# Patient Record
Sex: Female | Born: 1985 | Race: Black or African American | Hispanic: No | Marital: Single | Smoking: Never smoker
Health system: Southern US, Community
[De-identification: ages and names within clinical notes are randomized; demographics above are authoritative.]

---

## 2010-05-05 ENCOUNTER — Inpatient Hospital Stay (HOSPITAL_COMMUNITY): Admission: AD | Admit: 2010-05-05 | Discharge: 2010-05-05 | Payer: Self-pay | Admitting: Obstetrics & Gynecology

## 2010-05-05 ENCOUNTER — Ambulatory Visit: Payer: Self-pay | Admitting: Obstetrics & Gynecology

## 2010-06-16 ENCOUNTER — Ambulatory Visit (HOSPITAL_COMMUNITY): Admission: RE | Admit: 2010-06-16 | Discharge: 2010-06-16 | Payer: Self-pay | Admitting: Family Medicine

## 2010-07-04 ENCOUNTER — Inpatient Hospital Stay (HOSPITAL_COMMUNITY): Admission: AD | Admit: 2010-07-04 | Discharge: 2010-07-05 | Payer: Self-pay | Admitting: Obstetrics & Gynecology

## 2010-07-04 ENCOUNTER — Ambulatory Visit: Payer: Self-pay | Admitting: Physician Assistant

## 2010-07-30 ENCOUNTER — Ambulatory Visit (HOSPITAL_COMMUNITY): Admission: RE | Admit: 2010-07-30 | Discharge: 2010-07-30 | Payer: Self-pay | Admitting: Family Medicine

## 2010-08-10 ENCOUNTER — Inpatient Hospital Stay (HOSPITAL_COMMUNITY): Admission: AD | Admit: 2010-08-10 | Discharge: 2010-08-10 | Payer: Self-pay | Admitting: Obstetrics & Gynecology

## 2010-08-10 ENCOUNTER — Ambulatory Visit: Payer: Self-pay | Admitting: Obstetrics and Gynecology

## 2010-12-06 ENCOUNTER — Encounter: Payer: Self-pay | Admitting: Family Medicine

## 2010-12-18 ENCOUNTER — Other Ambulatory Visit: Payer: Self-pay | Admitting: Family Medicine

## 2010-12-18 ENCOUNTER — Ambulatory Visit (HOSPITAL_COMMUNITY)
Admission: RE | Admit: 2010-12-18 | Discharge: 2010-12-18 | Disposition: A | Discharge status: Home / Self Care | Payer: Medicaid Other | Source: Ambulatory Visit | Attending: Obstetrics & Gynecology | Admitting: Obstetrics & Gynecology

## 2010-12-18 DIAGNOSIS — I498 Other specified cardiac arrhythmias: Secondary | ICD-10-CM | POA: Insufficient documentation

## 2010-12-21 ENCOUNTER — Encounter (HOSPITAL_COMMUNITY): Payer: Self-pay

## 2010-12-21 ENCOUNTER — Ambulatory Visit (HOSPITAL_COMMUNITY)
Admission: RE | Admit: 2010-12-21 | Discharge: 2010-12-21 | Disposition: A | Discharge status: Home / Self Care | Payer: Medicaid Other | Source: Ambulatory Visit | Attending: Family Medicine | Admitting: Family Medicine

## 2010-12-21 DIAGNOSIS — Z3689 Encounter for other specified antenatal screening: Secondary | ICD-10-CM | POA: Insufficient documentation

## 2010-12-21 DIAGNOSIS — O352XX0 Maternal care for (suspected) hereditary disease in fetus, not applicable or unspecified: Secondary | ICD-10-CM | POA: Insufficient documentation

## 2010-12-21 DIAGNOSIS — O3660X0 Maternal care for excessive fetal growth, unspecified trimester, not applicable or unspecified: Secondary | ICD-10-CM | POA: Insufficient documentation

## 2010-12-24 ENCOUNTER — Other Ambulatory Visit: Payer: Self-pay | Admitting: Family Medicine

## 2010-12-24 DIAGNOSIS — O480 Post-term pregnancy: Secondary | ICD-10-CM

## 2010-12-28 ENCOUNTER — Inpatient Hospital Stay (HOSPITAL_COMMUNITY)
Admission: AD | Admit: 2010-12-28 | Discharge: 2010-12-30 | DRG: 775 | Disposition: A | Discharge status: Home / Self Care | Payer: Medicaid Other | Source: Ambulatory Visit | Attending: Obstetrics and Gynecology | Admitting: Obstetrics and Gynecology

## 2010-12-28 ENCOUNTER — Ambulatory Visit (HOSPITAL_COMMUNITY): Admission: RE | Admit: 2010-12-28 | Payer: Medicaid Other | Source: Ambulatory Visit

## 2010-12-28 DIAGNOSIS — O99892 Other specified diseases and conditions complicating childbirth: Secondary | ICD-10-CM | POA: Diagnosis present

## 2010-12-28 DIAGNOSIS — O4290 Premature rupture of membranes, unspecified as to length of time between rupture and onset of labor, unspecified weeks of gestation: Secondary | ICD-10-CM

## 2010-12-28 DIAGNOSIS — Z22330 Carrier of Group B streptococcus: Secondary | ICD-10-CM

## 2010-12-28 DIAGNOSIS — O9989 Other specified diseases and conditions complicating pregnancy, childbirth and the puerperium: Secondary | ICD-10-CM

## 2010-12-28 LAB — CBC
HCT: 33.4 % — ABNORMAL LOW (ref 36.0–46.0)
Hemoglobin: 10.7 g/dL — ABNORMAL LOW (ref 12.0–15.0)
MCHC: 32 g/dL (ref 30.0–36.0)
MCV: 83.3 fL (ref 78.0–100.0)
Platelets: 230 10*3/uL (ref 150–400)
RBC: 4.01 MIL/uL (ref 3.87–5.11)
WBC: 9 10*3/uL (ref 4.0–10.5)

## 2010-12-29 DIAGNOSIS — O9989 Other specified diseases and conditions complicating pregnancy, childbirth and the puerperium: Secondary | ICD-10-CM

## 2010-12-29 DIAGNOSIS — O4290 Premature rupture of membranes, unspecified as to length of time between rupture and onset of labor, unspecified weeks of gestation: Secondary | ICD-10-CM

## 2011-01-28 LAB — URINALYSIS, ROUTINE W REFLEX MICROSCOPIC
Bilirubin Urine: NEGATIVE
Glucose, UA: NEGATIVE mg/dL
Hgb urine dipstick: NEGATIVE
Ketones, ur: NEGATIVE mg/dL
Nitrite: NEGATIVE
Protein, ur: NEGATIVE mg/dL
Specific Gravity, Urine: >1.03 — ABNORMAL HIGH (ref 1.005–1.030)
Urobilinogen, UA: 0.2 mg/dL (ref 0.0–1.0)
pH: 6 (ref 5.0–8.0)

## 2011-01-28 LAB — URINE MICROSCOPIC-ADD ON

## 2011-01-28 LAB — GC/CHLAMYDIA PROBE AMP, GENITAL
Chlamydia, DNA Probe: NEGATIVE
GC Probe Amp, Genital: NEGATIVE

## 2011-01-31 LAB — CBC
MCHC: 33.8 g/dL (ref 30.0–36.0)
MCV: 91.3 fL (ref 78.0–100.0)

## 2011-01-31 LAB — URINALYSIS, ROUTINE W REFLEX MICROSCOPIC
Bilirubin Urine: NEGATIVE
Glucose, UA: NEGATIVE mg/dL
Ketones, ur: 40 mg/dL — AB
Leukocytes, UA: NEGATIVE
Specific Gravity, Urine: >1.03 — ABNORMAL HIGH (ref 1.005–1.030)
pH: 6 (ref 5.0–8.0)

## 2011-01-31 LAB — WET PREP, GENITAL
Clue Cells Wet Prep HPF POC: NONE SEEN
Yeast Wet Prep HPF POC: NONE SEEN

## 2011-01-31 LAB — COMPREHENSIVE METABOLIC PANEL
AST: 21 U/L (ref 0–37)
Alkaline Phosphatase: 63 U/L (ref 39–117)
BUN: 10 mg/dL (ref 6–23)
CO2: 24 mEq/L (ref 19–32)
Calcium: 9.5 mg/dL (ref 8.4–10.5)
GFR calc non Af Amer: >60 mL/min (ref >60)
Glucose, Bld: 80 mg/dL (ref 70–99)
Potassium: 4 mEq/L (ref 3.5–5.1)

## 2011-01-31 LAB — URINE MICROSCOPIC-ADD ON

## 2011-01-31 LAB — POCT PREGNANCY, URINE: Preg Test, Ur: POSITIVE

## 2012-02-11 IMAGING — US US OB NUCHAL TRANSLUCENCY 1ST GEST
1 series · 14 of 28 positions shown · non-contrast
Comparison: none

OBSTETRICAL ULTRASOUND:
 This ultrasound was performed in The [HOSPITAL], and the [facility] report will be stored to [REDACTED] PACS.  This report is also available in [HOSPITAL]?s accessANYware.

[Series 1: us ob nuchal translucency 1st gest · 14 of 36 slices shown]
[im 2/36]
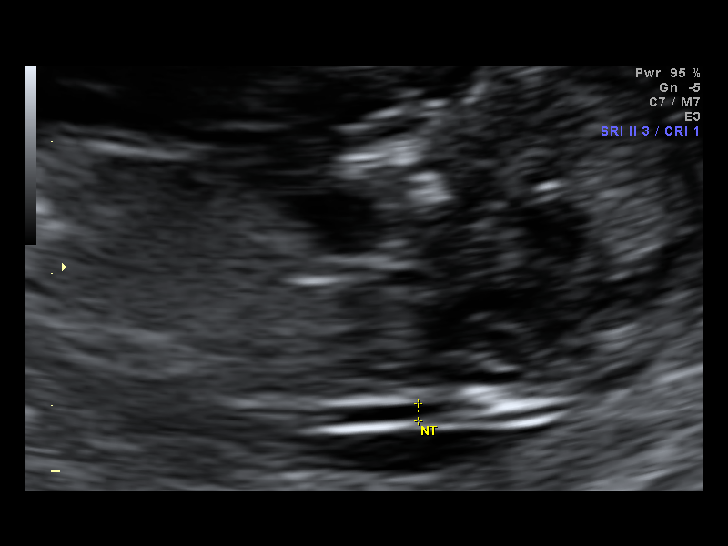
[im 4/36]
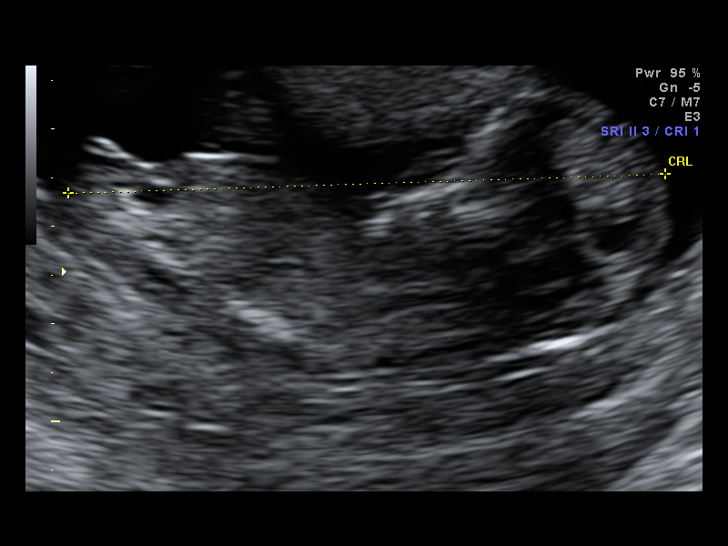
[im 7/36]
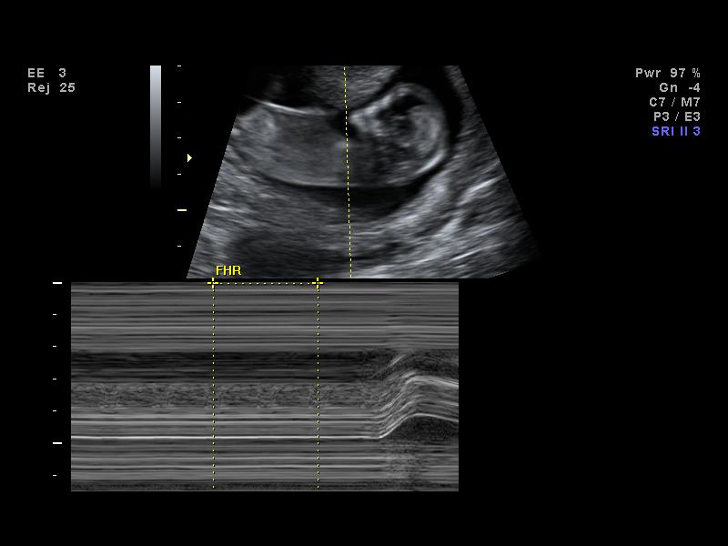
[im 10/36]
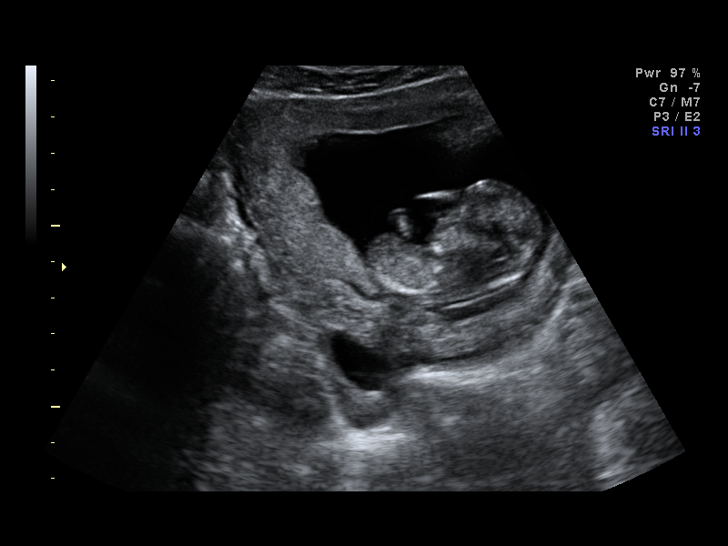
[im 12/36]
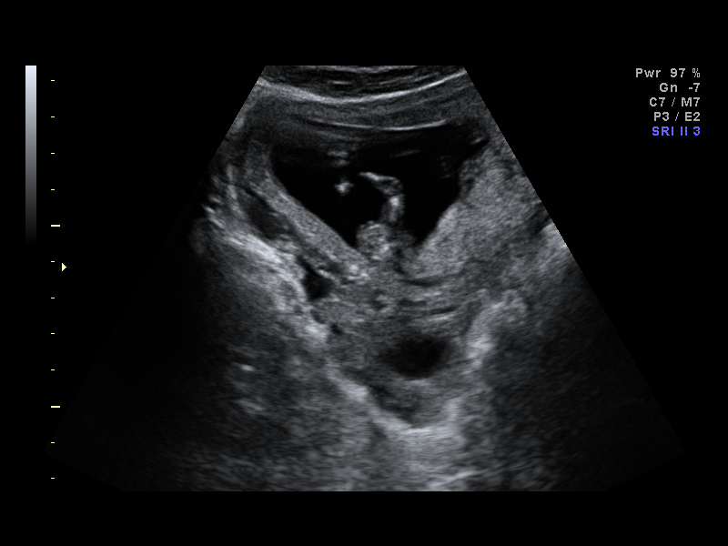
[im 15/36]
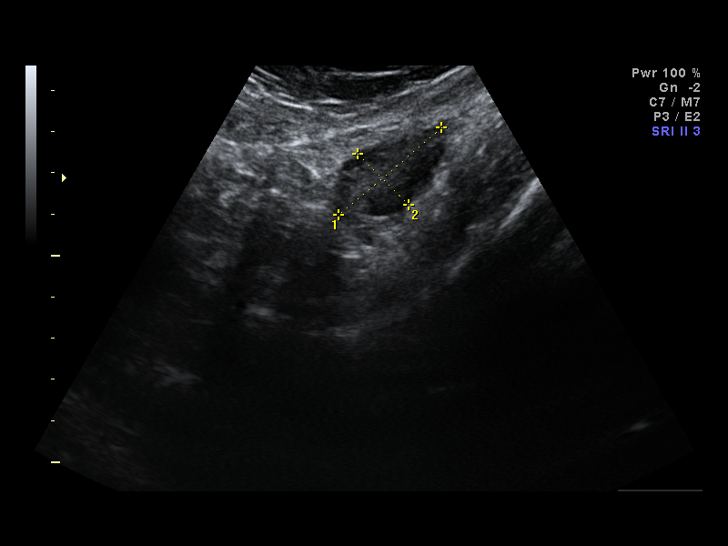
[im 17/36]
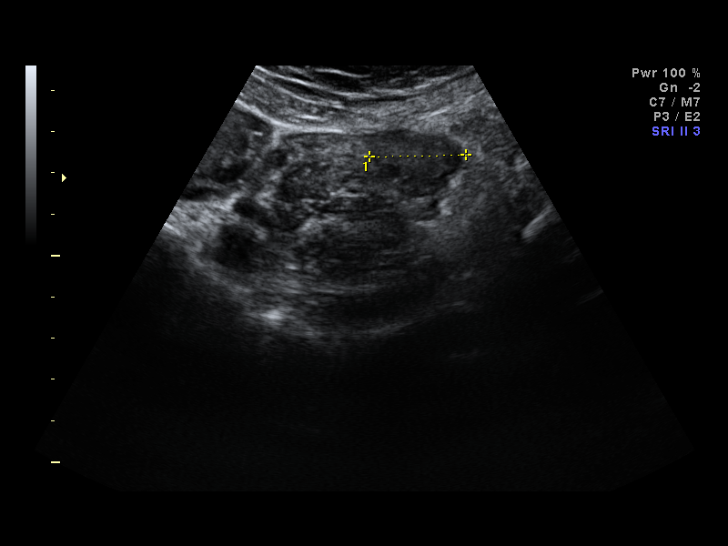
[im 20/36]
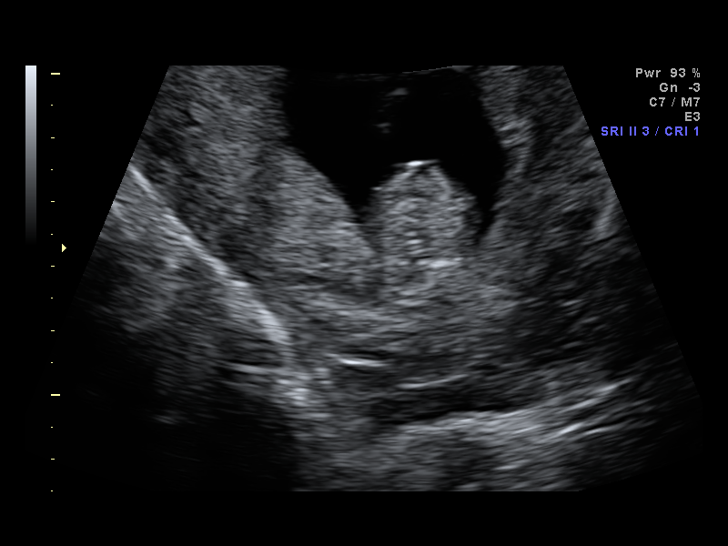
[im 23/36]
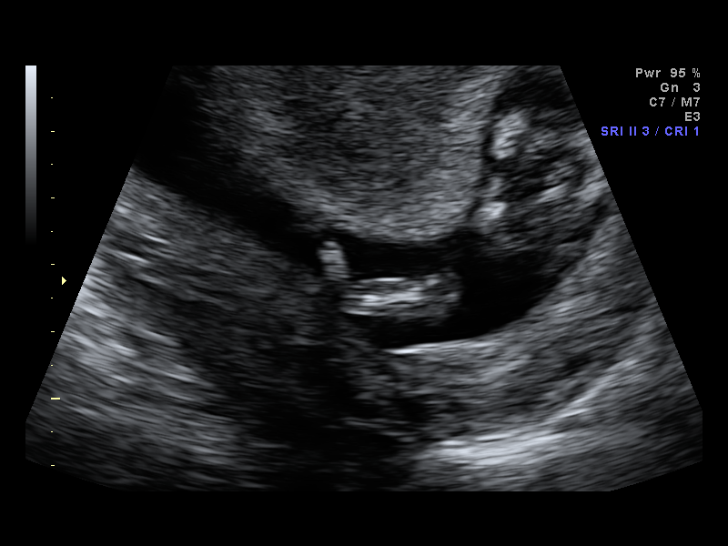
[im 25/36]
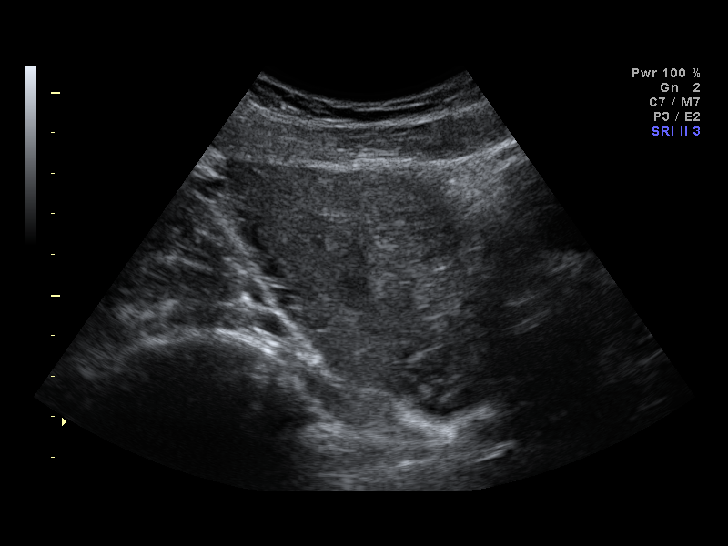
[im 28/36]
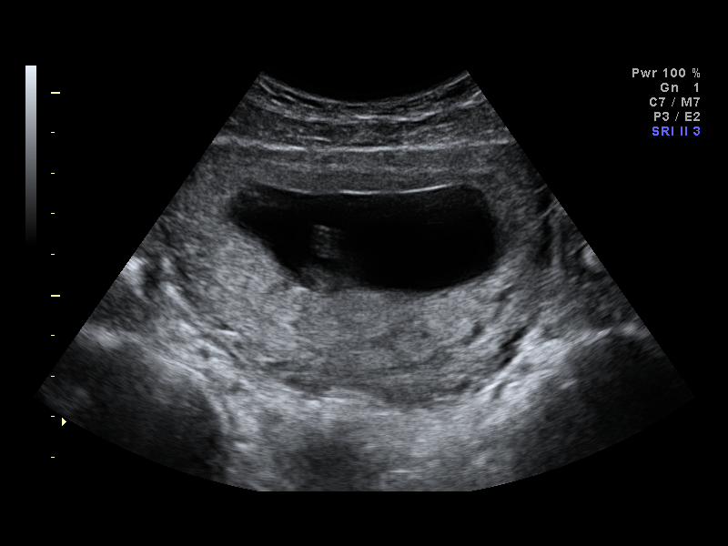
[im 30/36]
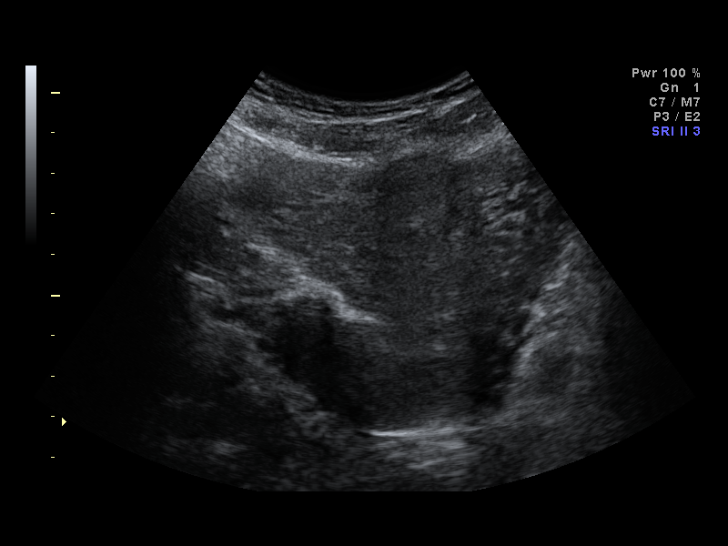
[im 33/36]
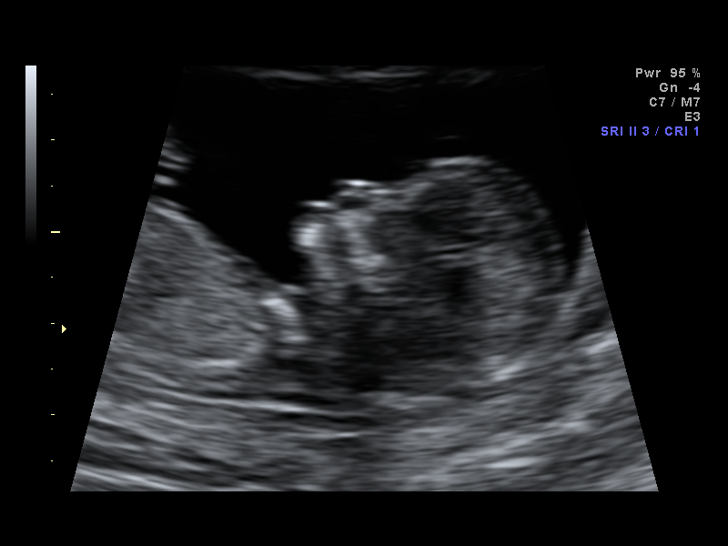
[im 36/36]
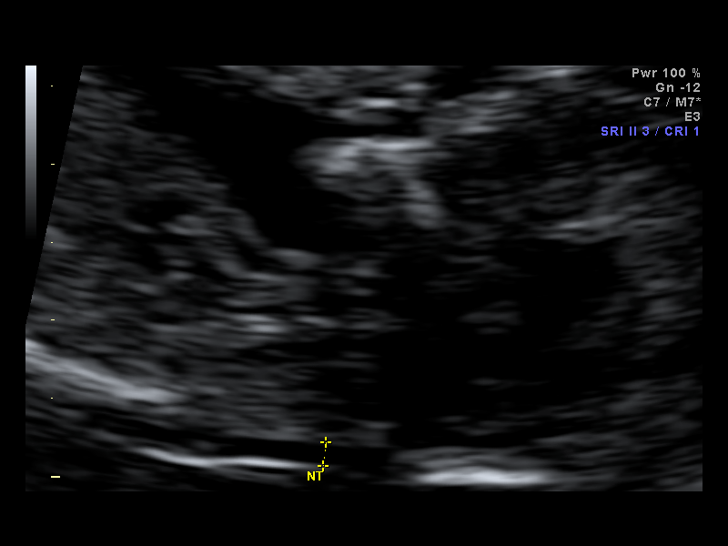

[14 of 28 positions shown; findings below may reference images not displayed]

IMPRESSION: [facility] has also been faxed to the ordering physician.

## 2012-08-17 IMAGING — US US OB FOLLOW-UP
1 series · 12 of 28 positions shown · non-contrast
Comparison: none

[Series 1: us ob follow up · 12 of 64 slices shown]
[im 3/64]
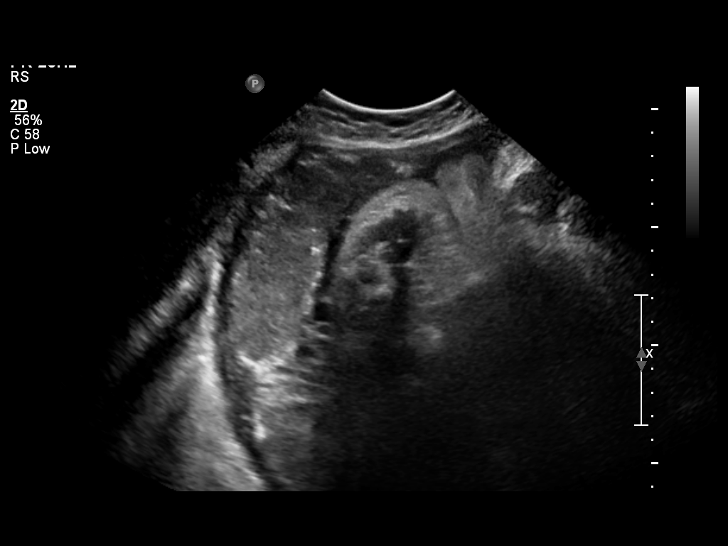
[im 8/64]
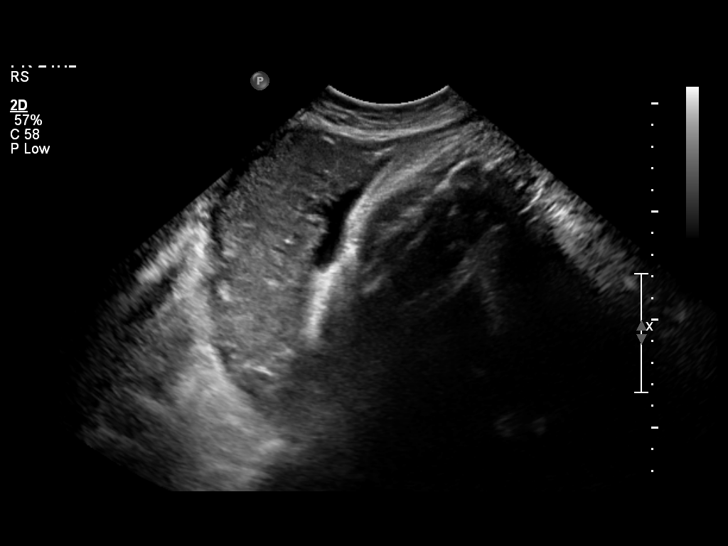
[im 12/64]
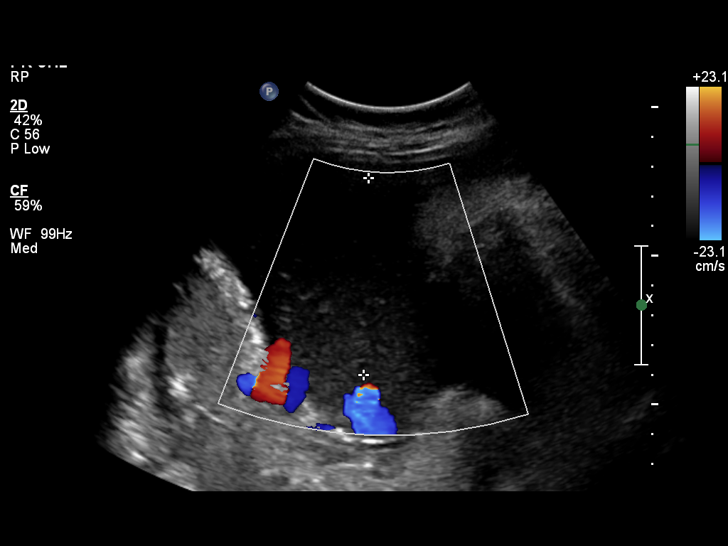
[im 19/64]
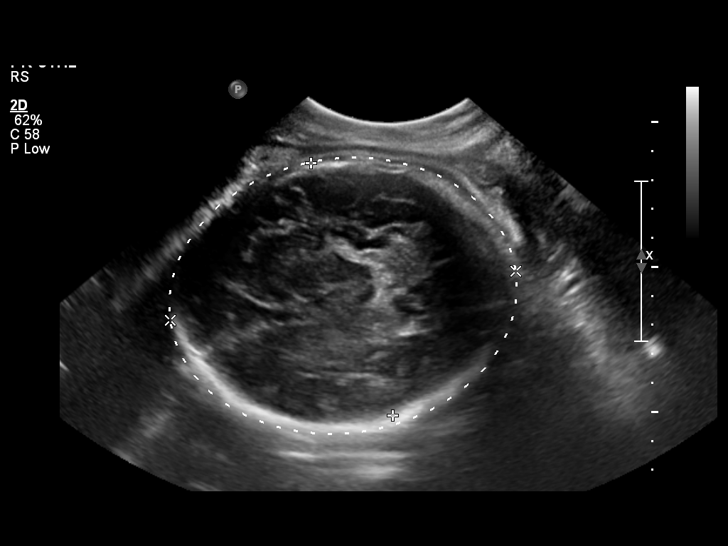
[im 24/64]
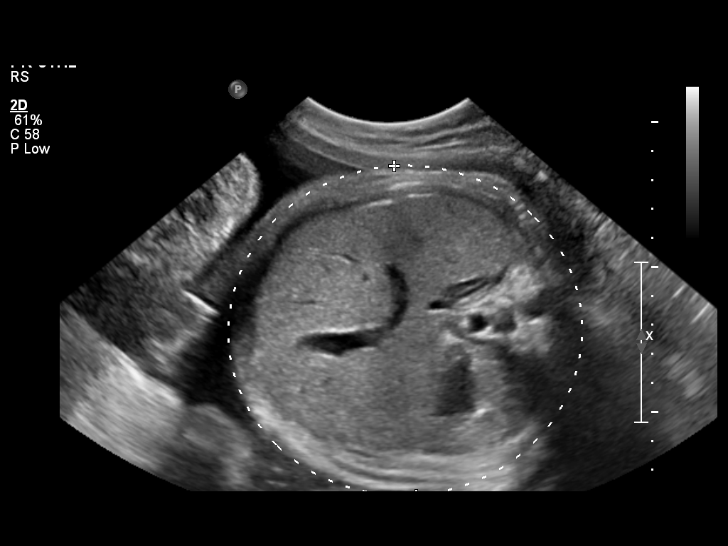
[im 29/64]
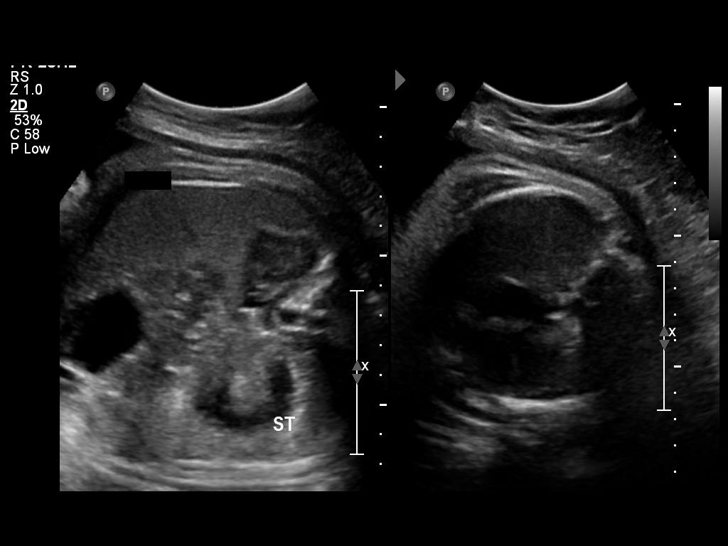
[im 36/64]
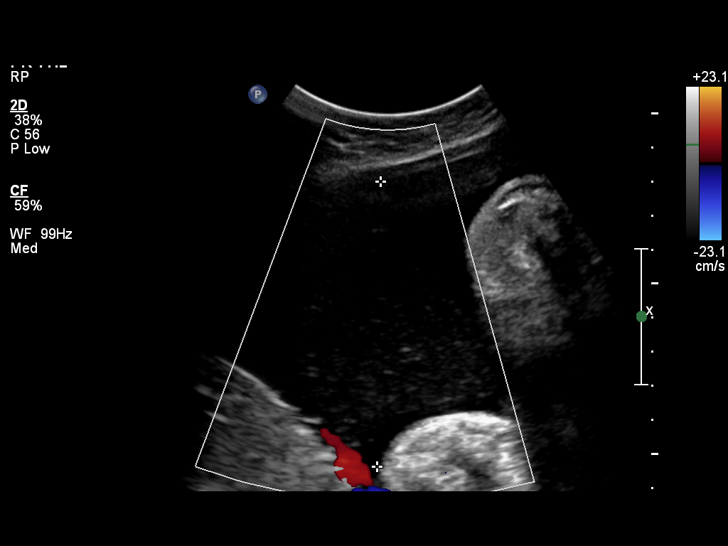
[im 40/64]
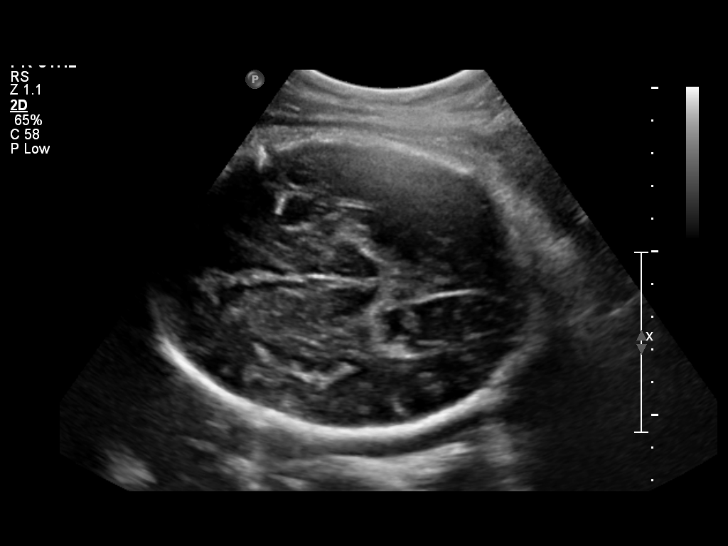
[im 45/64]
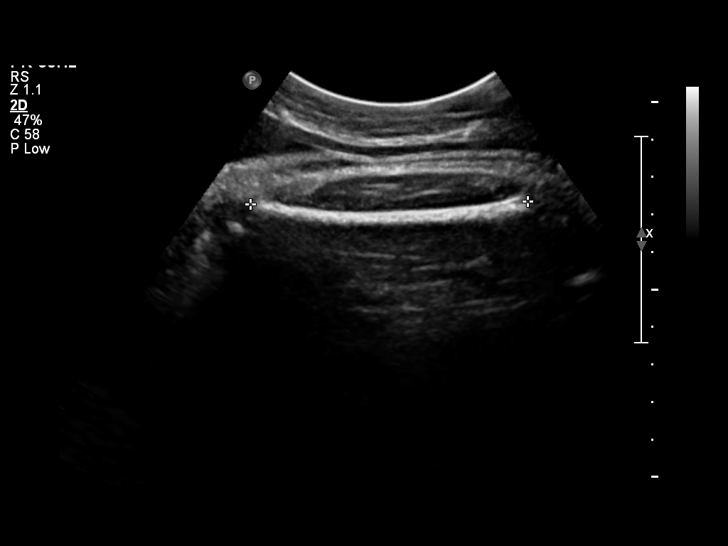
[im 52/64]
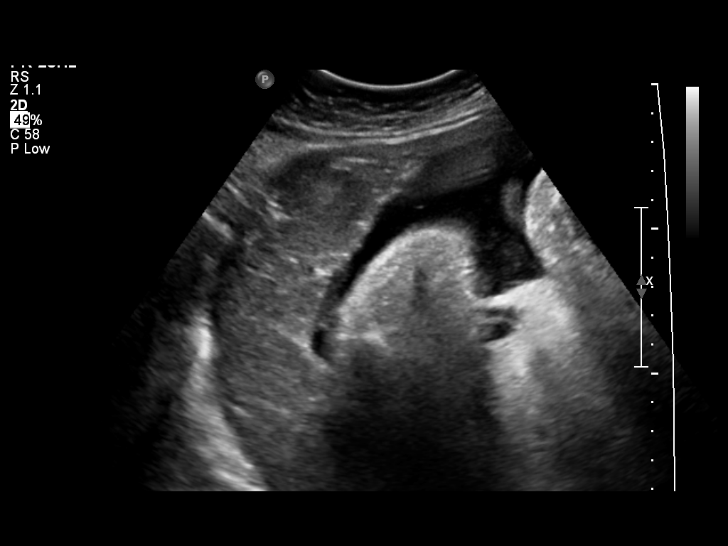
[im 57/64]
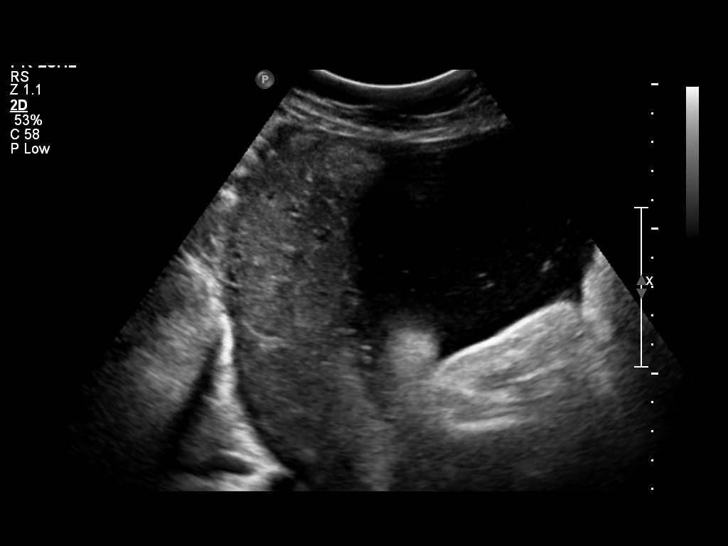
[im 61/64]
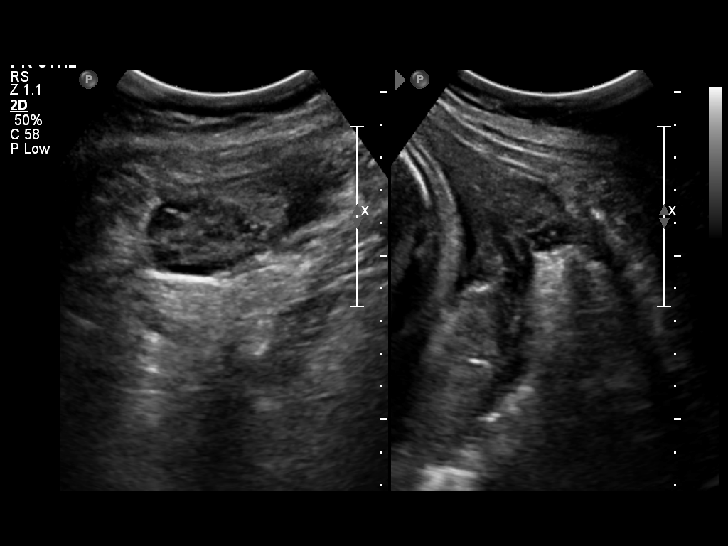

[12 of 28 positions shown; findings below may reference images not displayed]

OBSTETRICS REPORT
                      (Signed Final 12/21/2010 [DATE])

                 [REDACTED]-
                 Faculty Physician
 Order#:         [id number]
Procedures

 US OB FOLLOW UP                                       76816.1
Indications

 History of genetic / anatomic abnormality - pt with
 ear tag & brother polydactyly
 Size greater than dates (Large for gestational [AGE]
 Assess Fetal Growth / Estimated Fetal Weight
Fetal Evaluation

 Fetal Heart Rate:  156                          bpm
 Cardiac Activity:  Observed
 Presentation:      Cephalic
 Placenta:          Fundal, above cervical os
 P. Cord            Previously Visualized
 Insertion:

 Amniotic Fluid
 AFI FV:      Subjectively increased
 AFI Sum:     22.11   cm       95  %Tile     Larg Pckt:    8.33  cm
 RUQ:   8.33    cm   RLQ:    2.18   cm    LUQ:   4.34    cm   LLQ:    7.26   cm
Biometry

 BPD:     90.8  mm     G. Age:  36w 6d                CI:        72.61   70 - 86
                                                      FL/HC:      22.2   20.6 -

 HC:     338.9  mm     G. Age:  39w 0d       31  %    HC/AC:      0.92   0.87 -

 AC:     367.9  mm     G. Age:  40w 5d       94  %    FL/BPD:     82.7   71 - 87
 FL:      75.1  mm     G. Age:  38w 3d       34  %    FL/AC:      20.4   20 - 24
 Est. FW:    3525  gm      8 lb 6 oz   > 90  %
Gestational Age

 LMP:           39w 3d        Date:  03/20/10                 EDD:   12/25/10
 U/S Today:     38w 5d                                        EDD:   12/30/10
 Best:          39w 3d     Det. By:  LMP  (03/20/10)          EDD:   12/25/10
Anatomy

 Cranium:           Appears normal      Aortic Arch:       Previously seen
 Fetal Cavum:       Previously seen     Ductal Arch:       Previously seen
 Ventricles:        Appears normal      Diaphragm:         Appears normal
 Choroid Plexus:    Previously seen     Stomach:           Appears normal
 Cerebellum:        Previously seen     Abdomen:           Appears normal
 Posterior Fossa:   Previously seen     Abdominal Wall:    Previously seen
 Nuchal Fold:       Not applicable      Cord Vessels:      Previously seen
                    (>20 wks GA)
 Face:              Previously seen     Kidneys:           Appear normal
 Heart:             Not well            Bladder:           Appears normal
                    visualized
                    today. Seen
                    previously.
 RVOT:              Previously seen     Spine:             Previously seen
 LVOT:              Previously seen     Limbs:             Four extremities
                                                           prev. seen
Cervix Uterus Adnexa

 Cervix:       Not visualized (advanced GA >34 wks)

 Left Ovary:    Within normal limits.
 Right Ovary:   Within normal limits.
 Adnexa:     No abnormality visualized.
Impression

 Assigned GA is currently 39w 3d. Fetus is large-for-
 gestational-age (EFW of 3937gm >90th percentile).
 Amniotic fluid volume is subjectively increased, with AFI of
 22.11 cm (95 %ile).
 No late developing fetal abnormalities seen involving
 visualized anatomy.

 questions or concerns.

## 2014-09-16 ENCOUNTER — Encounter (HOSPITAL_COMMUNITY): Payer: Self-pay

## 2022-10-12 ENCOUNTER — Emergency Department (HOSPITAL_COMMUNITY)
Admission: EM | Admit: 2022-10-12 | Discharge: 2022-10-13 | Disposition: A | Discharge status: Home / Self Care | Payer: Self-pay | Attending: Emergency Medicine | Admitting: Emergency Medicine

## 2022-10-12 ENCOUNTER — Encounter (HOSPITAL_COMMUNITY): Payer: Self-pay

## 2022-10-12 ENCOUNTER — Other Ambulatory Visit: Payer: Self-pay

## 2022-10-12 DIAGNOSIS — B349 Viral infection, unspecified: Secondary | ICD-10-CM | POA: Insufficient documentation

## 2022-10-12 DIAGNOSIS — Z20822 Contact with and (suspected) exposure to covid-19: Secondary | ICD-10-CM | POA: Insufficient documentation

## 2022-10-12 DIAGNOSIS — N9489 Other specified conditions associated with female genital organs and menstrual cycle: Secondary | ICD-10-CM | POA: Insufficient documentation

## 2022-10-12 LAB — RESP PANEL BY RT-PCR (FLU A&B, COVID) ARPGX2
Influenza A by PCR: NEGATIVE
Influenza B by PCR: NEGATIVE
SARS Coronavirus 2 by RT PCR: NEGATIVE

## 2022-10-12 LAB — CBC WITH DIFFERENTIAL/PLATELET
Abs Immature Granulocytes: 0.01 10*3/uL (ref 0.00–0.07)
Basophils Absolute: 0 10*3/uL (ref 0.0–0.1)
Basophils Relative: 1 %
Eosinophils Absolute: 0 10*3/uL (ref 0.0–0.5)
Eosinophils Relative: 1 %
HCT: 37.2 % (ref 36.0–46.0)
Hemoglobin: 11.7 g/dL — ABNORMAL LOW (ref 12.0–15.0)
Immature Granulocytes: 0 %
Lymphocytes Relative: 35 %
Lymphs Abs: 1.6 10*3/uL (ref 0.7–4.0)
MCH: 25.9 pg — ABNORMAL LOW (ref 26.0–34.0)
MCHC: 31.5 g/dL (ref 30.0–36.0)
MCV: 82.5 fL (ref 80.0–100.0)
Monocytes Absolute: 1 10*3/uL (ref 0.1–1.0)
Monocytes Relative: 22 %
Neutro Abs: 1.9 10*3/uL (ref 1.7–7.7)
Neutrophils Relative %: 41 %
Platelets: 360 10*3/uL (ref 150–400)
RBC: 4.51 MIL/uL (ref 3.87–5.11)
RDW: 16.4 % — ABNORMAL HIGH (ref 11.5–15.5)
WBC: 4.6 10*3/uL (ref 4.0–10.5)
nRBC: 0 % (ref 0.0–0.2)

## 2022-10-12 LAB — I-STAT BETA HCG BLOOD, ED (MC, WL, AP ONLY): I-stat hCG, quantitative: <5 m[IU]/mL (ref <5)

## 2022-10-12 NOTE — ED Provider Triage Note (Signed)
Emergency Medicine Provider Triage Evaluation Note  Catherine Jenkins , a 36 y.o. female  was evaluated in triage.  Pt complains of cough, congestion, nausea, and diarrhea.  Also has body aches.  Recent sick exposures with similar.  She denies fever.  No vomiting.  Has not been eating/drinking much so worried about dehydration.  No meds taken PTA.  Review of Systems  Positive: Diarrhea, cough, body aches Negative: fever  Physical Exam  BP 127/75 (BP Location: Right Arm)   Pulse 83   Temp 98.2 F (36.8 C) (Oral)   Resp 16   Ht 5\' 2"  (1.575 m)   Wt 107 kg   SpO2 98%   BMI 43.16 kg/m  Gen:   Awake, no distress   Resp:  Normal effort  MSK:   Moves extremities without difficulty  Other:  + nasal congestion  Medical Decision Making  Medically screening exam initiated at 10:44 PM.  Appropriate orders placed.  Catherine Jenkins was informed that the remainder of the evaluation will be completed by another provider, this initial triage assessment does not replace that evaluation, and the importance of remaining in the ED until their evaluation is complete.  Cough, bodyaches, diarrhea.  Afebrile, non-toxic in triage.  Some sick contacts.  Will check labs, covid/flu screen.   Catherine Rife, PA-C 10/12/22 2246

## 2022-10-12 NOTE — ED Triage Notes (Signed)
Pt arrived to triage complaining of body aches and nausea States that several family members at home have had RSV and similar symptoms   Pt states she hasn't been able to go to a doctors because of no insurance

## 2022-10-13 ENCOUNTER — Emergency Department (HOSPITAL_COMMUNITY): Payer: Self-pay

## 2022-10-13 LAB — COMPREHENSIVE METABOLIC PANEL
ALT: 11 U/L (ref 0–44)
AST: 15 U/L (ref 15–41)
Albumin: 3.7 g/dL (ref 3.5–5.0)
Alkaline Phosphatase: 82 U/L (ref 38–126)
Anion gap: 9 (ref 5–15)
BUN: 10 mg/dL (ref 6–20)
CO2: 24 mmol/L (ref 22–32)
Calcium: 8.8 mg/dL — ABNORMAL LOW (ref 8.9–10.3)
Chloride: 105 mmol/L (ref 98–111)
Creatinine, Ser: 0.73 mg/dL (ref 0.44–1.00)
GFR, Estimated: >60 mL/min (ref >60)
Glucose, Bld: 94 mg/dL (ref 70–99)
Potassium: 3.9 mmol/L (ref 3.5–5.1)
Sodium: 138 mmol/L (ref 135–145)
Total Bilirubin: 0.3 mg/dL (ref 0.3–1.2)
Total Protein: 7.1 g/dL (ref 6.5–8.1)

## 2022-10-13 NOTE — ED Provider Notes (Signed)
[facility] EMERGENCY DEPARTMENT Provider Note   CSN: [id number] Arrival date & time: 10/12/22  2157     History  Chief Complaint  Patient presents with   Generalized Body Aches    ROSABEL SERMENO is a 36 y.o. female.  HPI     This is a 36 year old female who presents with upper respiratory symptoms.  She states that her daughter and aunt have upper respiratory symptoms and her aunt was diagnosed with RSV.  Patient reports cough, congestion, nausea, diarrhea.  She reports body aches without noted fevers.  Home Medications Prior to Admission medications   Not on File      Allergies    Patient has no known allergies.    Review of Systems   Review of Systems  Constitutional:  Negative for fever.  Respiratory:  Positive for cough. Negative for shortness of breath.   Gastrointestinal:  Positive for nausea.  Musculoskeletal:  Positive for myalgias.  All other systems reviewed and are negative.   Physical Exam Updated Vital Signs BP (!) 110/56 (BP Location: Left Arm)   Pulse 82   Temp 98.3 F (36.8 C) (Oral)   Resp 15   Ht 1.575 m (5\' 2" )   Wt 107 kg   SpO2 100%   BMI 43.16 kg/m  Physical Exam Vitals and nursing note reviewed.  Constitutional:      Appearance: She is well-developed. She is obese. She is not ill-appearing.  HENT:     Head: Normocephalic and atraumatic.     Nose: Congestion present.     Mouth/Throat:     Mouth: Mucous membranes are moist.  Eyes:     Pupils: Pupils are equal, round, and reactive to light.  Cardiovascular:     Rate and Rhythm: Normal rate and regular rhythm.     Heart sounds: Normal heart sounds.  Pulmonary:     Effort: Pulmonary effort is normal. No respiratory distress.     Breath sounds: No wheezing.  Abdominal:     General: Bowel sounds are normal.     Palpations: Abdomen is soft.     Tenderness: There is no abdominal tenderness. There is no rebound.  Musculoskeletal:     Cervical back: Neck  supple.  Skin:    General: Skin is warm and dry.  Neurological:     Mental Status: She is alert and oriented to person, place, and time.  Psychiatric:        Mood and Affect: Mood normal.     ED Results / Procedures / Treatments   Labs (all labs ordered are listed, but only abnormal results are displayed) Labs Reviewed  CBC WITH DIFFERENTIAL/PLATELET - Abnormal; Notable for the following components:      Result Value   Hemoglobin 11.7 (*)    MCH 25.9 (*)    RDW 16.4 (*)    All other components within normal limits  COMPREHENSIVE METABOLIC PANEL - Abnormal; Notable for the following components:   Calcium 8.8 (*)    All other components within normal limits  RESP PANEL BY RT-PCR (FLU A&B, COVID) [id number]  I-STAT BETA HCG BLOOD, ED ([facility], [facility], [facility] ONLY)    EKG None  Radiology DG Chest 2 View  Result Date: 10/13/2022 CLINICAL DATA:  Cough EXAM: CHEST - 2 VIEW COMPARISON:  None Available. FINDINGS: The heart size and mediastinal contours are within normal limits. Both lungs are clear. The visualized skeletal structures are unremarkable. IMPRESSION: No active cardiopulmonary disease. Electronically Signed  By: Alcide Clever M.D.   On: 10/13/2022 02:37    Procedures Procedures    Medications Ordered in ED Medications - No data to display  ED Course/ Medical Decision Making/ A&P                           Medical Decision Making Amount and/or Complexity of Data Reviewed Radiology: ordered.   This patient presents to the ED for concern of upper respiratory symptoms, this involves an extensive number of treatment options, and is a complaint that carries with it a high risk of complications and morbidity.  I considered the following differential and admission for this acute, potentially life threatening condition.  The differential diagnosis includes viral illness such as COVID, influenza, RSV, pneumonia  MDM:    This is a 36 year old female who presents with upper respiratory  symptoms.  She is overall nontoxic and vital signs are reassuring.  She does not appear dehydrated at this time.  Labs obtained and reviewed and are largely unremarkable including negative COVID and influenza testing.  Chest x-ray obtained and shows no evidence of pneumonia.  We discussed supportive measures including hydration, Tylenol or Motrin for body aches or pains.  Patient stated understanding.  (Labs, imaging, consults)  Labs: I Ordered, and personally interpreted labs.  The pertinent results include: CBC, CMP, COVID and influenza testing  Imaging Studies ordered: I ordered imaging studies including chest x-ray I independently visualized and interpreted imaging. I agree with the radiologist interpretation  Additional history obtained from chart review.  External records from outside source obtained and reviewed including prior evaluations  Cardiac Monitoring: The patient was maintained on a cardiac monitor.  I personally viewed and interpreted the cardiac monitored which showed an underlying rhythm of: Sinus rhythm  Reevaluation: After the interventions noted above, I reevaluated the patient and found that they have :improved  Social Determinants of Health:  lives independently  Disposition: Discharge  Co morbidities that complicate the patient evaluation History reviewed. No pertinent past medical history.   Medicines No orders of the defined types were placed in this encounter.   I have reviewed the patients home medicines and have made adjustments as needed  Problem List / ED Course: Problem List Items Addressed This Visit   None Visit Diagnoses     Viral illness    -  Primary                   Final Clinical Impression(s) / ED Diagnoses Final diagnoses:  None    Rx / DC Orders ED Discharge Orders     None         Shon Baton, MD 10/13/22 [phone]

## 2022-10-13 NOTE — Discharge Instructions (Signed)
You were seen today for upper respiratory symptoms.  This is likely related to a virus.  Your chest x-ray does not show pneumonia.  Take Tylenol or ibuprofen for body aches or pains.  Make sure that you are staying hydrated.

## 2023-01-11 ENCOUNTER — Ambulatory Visit (INDEPENDENT_AMBULATORY_CARE_PROVIDER_SITE_OTHER): Payer: Medicaid Other | Admitting: Primary Care

## 2023-01-11 ENCOUNTER — Encounter (INDEPENDENT_AMBULATORY_CARE_PROVIDER_SITE_OTHER): Payer: Self-pay | Admitting: Primary Care

## 2023-01-11 VITALS — BP 110/76 | HR 81 | Resp 16 | Ht 62.0 in | Wt 228.0 lb

## 2023-01-11 DIAGNOSIS — Z7689 Persons encountering health services in other specified circumstances: Secondary | ICD-10-CM

## 2023-01-11 DIAGNOSIS — Z1159 Encounter for screening for other viral diseases: Secondary | ICD-10-CM

## 2023-01-11 DIAGNOSIS — Z114 Encounter for screening for human immunodeficiency virus [HIV]: Secondary | ICD-10-CM

## 2023-01-11 DIAGNOSIS — Z823 Family history of stroke: Secondary | ICD-10-CM | POA: Diagnosis not present

## 2023-01-11 DIAGNOSIS — N926 Irregular menstruation, unspecified: Secondary | ICD-10-CM | POA: Diagnosis not present

## 2023-01-11 DIAGNOSIS — Z833 Family history of diabetes mellitus: Secondary | ICD-10-CM | POA: Diagnosis not present

## 2023-01-11 DIAGNOSIS — F418 Other specified anxiety disorders: Secondary | ICD-10-CM | POA: Diagnosis not present

## 2023-01-11 DIAGNOSIS — N393 Stress incontinence (female) (male): Secondary | ICD-10-CM

## 2023-01-11 NOTE — Progress Notes (Unsigned)
New Patient Office Visit  Subjective    Patient ID: Catherine Jenkins, female    DOB: 19-Apr-1986  Age: 37 y.o. MRN: [id number]  CC:  Chief Complaint  Patient presents with   New Patient (Initial Visit)   urine incontience    HPI [name] is a 37 year old morbid obese female presents to establish care. She voices stress incontinence more prominent after have 2nd child. Explained pelvic floor may become weak. Discussed exercises that can strengthen .   No outpatient encounter medications on file as of 01/11/2023.   No facility-administered encounter medications on file as of 01/11/2023.    No past medical history on file.  No past surgical history on file.  No family history on file.  Social History   Socioeconomic History   Marital status: Single    Spouse name: Not on file   Number of children: Not on file   Years of education: Not on file   Highest education level: Not on file  Occupational History   Not on file  Tobacco Use   Smoking status: Never   Smokeless tobacco: Never  Substance and Sexual Activity   Alcohol use: Not on file   Drug use: Not on file   Sexual activity: Not on file  Other Topics Concern   Not on file  Social History Narrative   Not on file   Social Determinants of Health   Financial Resource Strain: Not on file  Food Insecurity: Not on file  Transportation Needs: Not on file  Physical Activity: Not on file  Stress: Not on file  Social Connections: Not on file  Intimate Partner Violence: Not on file    ROS Comprehensive ROS Pertinent positive and negative noted in HPI       Objective    Blood Pressure 110/76   Pulse 81   Respiration 16   Height '5\' 2"'$  (1.575 m)   Weight 228 lb (103.4 kg)   Oxygen Saturation 100%   Body Mass Index 41.70 kg/m  Physical exam: General: Vital signs reviewed.  Patient is well-developed and well-nourished, morbid obesity  in no acute distress and cooperative with exam. Head:  Normocephalic and atraumatic. Eyes: EOMI, conjunctivae normal, no scleral icterus. Neck: Supple, trachea midline, normal ROM, no JVD, masses, thyromegaly, or carotid bruit present. Cardiovascular: RRR, S1 normal, S2 normal, no murmurs, gallops, or rubs. Pulmonary/Chest: Clear to auscultation bilaterally, no wheezes, rales, or rhonchi. Abdominal: Soft, non-tender, non-distended, BS +, no masses, organomegaly, or guarding present. Musculoskeletal: No joint deformities, erythema, or stiffness, ROM full and nontender. Extremities: No lower extremity edema bilaterally,  pulses symmetric and intact bilaterally. No cyanosis or clubbing. Neurological: A&O x3, Strength is normal Skin: Warm, dry and intact. No rashes or erythema. Psychiatric: Normal mood and affect. speech and behavior is normal. Cognition and memory are normal.     Assessment & Plan:  Catherine Jenkins was seen today for new patient (initial visit) and urine incontience.  Diagnoses and all orders for this visit:  Encounter for screening for HIV -     HIV Antibody (routine testing w rflx)  Encounter to establish care  Depression with anxiety Flowsheet Row Office Visit from 01/11/2023 in [facility]  PHQ-9 Total Score 12      Refer to CSW  Morbidly obese ([place]) -     Lipid panel -     TSH + free T4  Stress incontinence -     CMP14+EGFR  Family  history of diabetes mellitus (DM) -     Hemoglobin A1c  Family history of cerebrovascular accident (CVA) in maternal grandmother -     Lipid panel  Encounter for HCV screening test for low risk patient -     HCV Ab w Reflex to Quant PCR  Irregular menstrual cycle -     CBC with Differential/Platelet  Other orders -     Interpretation:     Catherine Perna, NP

## 2023-01-11 NOTE — Patient Instructions (Signed)
Kegel Exercises There are many reasons your provider may suggest Kegel exercises. This video will teach you how to do Kegel exercises. To view the content, go to this web address: [email or web address]  This video will expire on: 10/27/2024. If you need access to this video following this date, please reach out to the healthcare provider who assigned it to you. This information is not intended to replace advice given to you by your health care provider. Make sure you discuss any questions you have with your health care provider. Elsevier Patient Education  [place].

## 2023-01-12 LAB — CMP14+EGFR
ALT: 9 IU/L (ref 0–32)
AST: 13 IU/L (ref 0–40)
Albumin/Globulin Ratio: 1.5 (ref 1.2–2.2)
Albumin: 4.3 g/dL (ref 3.9–4.9)
Alkaline Phosphatase: 97 IU/L (ref 44–121)
BUN/Creatinine Ratio: 16 (ref 9–23)
BUN: 11 mg/dL (ref 6–20)
Bilirubin Total: <0.2 mg/dL (ref 0.0–1.2)
CO2: 22 mmol/L (ref 20–29)
Calcium: 9.1 mg/dL (ref 8.7–10.2)
Chloride: 103 mmol/L (ref 96–106)
Creatinine, Ser: 0.68 mg/dL (ref 0.57–1.00)
Globulin, Total: 2.8 g/dL (ref 1.5–4.5)
Glucose: 104 mg/dL — ABNORMAL HIGH (ref 70–99)
Potassium: 4.3 mmol/L (ref 3.5–5.2)
Sodium: 139 mmol/L (ref 134–144)
Total Protein: 7.1 g/dL (ref 6.0–8.5)
eGFR: 117 mL/min/{1.73_m2} (ref >59)

## 2023-01-12 LAB — CBC WITH DIFFERENTIAL/PLATELET
Basophils Absolute: 0 10*3/uL (ref 0.0–0.2)
Basos: 0 %
EOS (ABSOLUTE): 0.1 10*3/uL (ref 0.0–0.4)
Eos: 1 %
Hematocrit: 36.7 % (ref 34.0–46.6)
Hemoglobin: 11.4 g/dL (ref 11.1–15.9)
Immature Grans (Abs): 0 10*3/uL (ref 0.0–0.1)
Immature Granulocytes: 0 %
Lymphocytes Absolute: 2.7 10*3/uL (ref 0.7–3.1)
Lymphs: 34 %
MCH: 26 pg — ABNORMAL LOW (ref 26.6–33.0)
MCHC: 31.1 g/dL — ABNORMAL LOW (ref 31.5–35.7)
MCV: 84 fL (ref 79–97)
Monocytes Absolute: 0.7 10*3/uL (ref 0.1–0.9)
Monocytes: 9 %
Neutrophils Absolute: 4.3 10*3/uL (ref 1.4–7.0)
Neutrophils: 56 %
Platelets: 421 10*3/uL (ref 150–450)
RBC: 4.39 x10E6/uL (ref 3.77–5.28)
RDW: 16.4 % — ABNORMAL HIGH (ref 11.7–15.4)
WBC: 7.8 10*3/uL (ref 3.4–10.8)

## 2023-01-12 LAB — TSH+FREE T4
Free T4: 1.26 ng/dL (ref 0.82–1.77)
TSH: 1.34 u[IU]/mL (ref 0.450–4.500)

## 2023-01-12 LAB — LIPID PANEL
Chol/HDL Ratio: 3 ratio (ref 0.0–4.4)
Cholesterol, Total: 178 mg/dL (ref 100–199)
HDL: 60 mg/dL (ref >39)
LDL Chol Calc (NIH): 93 mg/dL (ref 0–99)
Triglycerides: 144 mg/dL (ref 0–149)
VLDL Cholesterol Cal: 25 mg/dL (ref 5–40)

## 2023-01-12 LAB — HEMOGLOBIN A1C
Est. average glucose Bld gHb Est-mCnc: 120 mg/dL
Hgb A1c MFr Bld: 5.8 % — ABNORMAL HIGH (ref 4.8–5.6)

## 2023-01-12 LAB — HCV AB W REFLEX TO QUANT PCR: HCV Ab: NONREACTIVE

## 2023-01-12 LAB — HCV INTERPRETATION

## 2023-01-12 LAB — HIV ANTIBODY (ROUTINE TESTING W REFLEX): HIV Screen 4th Generation wRfx: NONREACTIVE

## 2023-01-13 ENCOUNTER — Encounter (INDEPENDENT_AMBULATORY_CARE_PROVIDER_SITE_OTHER): Payer: Self-pay | Admitting: Primary Care

## 2023-01-13 ENCOUNTER — Telehealth (INDEPENDENT_AMBULATORY_CARE_PROVIDER_SITE_OTHER): Payer: Self-pay | Admitting: Primary Care

## 2023-01-13 ENCOUNTER — Ambulatory Visit (INDEPENDENT_AMBULATORY_CARE_PROVIDER_SITE_OTHER): Payer: Medicaid Other | Admitting: Primary Care

## 2023-01-13 NOTE — Telephone Encounter (Signed)
This has been addressed.

## 2023-01-13 NOTE — Telephone Encounter (Signed)
Letter is in [employer]

## 2023-01-13 NOTE — Telephone Encounter (Signed)
Pt is calling to ask if the letter for work can be sen by way of [place]. Pt will activate her MyChart around 8:30a.   Activation code has been sent. Please advise CB- [id number]

## 2023-01-18 ENCOUNTER — Ambulatory Visit (INDEPENDENT_AMBULATORY_CARE_PROVIDER_SITE_OTHER): Payer: Medicaid Other | Admitting: Primary Care

## 2023-02-08 ENCOUNTER — Telehealth (INDEPENDENT_AMBULATORY_CARE_PROVIDER_SITE_OTHER): Payer: Self-pay | Admitting: Licensed Clinical Social Worker

## 2023-02-08 NOTE — Telephone Encounter (Signed)
LCSWA called patient today to introduce herself and to assess patients' mental health needs. Patient did not answer the phone. LCSWA was able to leave a brief message with the patient asking them to return the call. Patient was referred by PCP for stress, anxiety and depression.

## 2023-02-28 ENCOUNTER — Ambulatory Visit (INDEPENDENT_AMBULATORY_CARE_PROVIDER_SITE_OTHER): Payer: Medicaid Other | Admitting: Primary Care

## 2023-03-03 ENCOUNTER — Ambulatory Visit (INDEPENDENT_AMBULATORY_CARE_PROVIDER_SITE_OTHER): Payer: Medicaid Other | Admitting: Primary Care

## 2023-03-03 ENCOUNTER — Encounter (INDEPENDENT_AMBULATORY_CARE_PROVIDER_SITE_OTHER): Payer: Self-pay | Admitting: Primary Care

## 2023-03-03 VITALS — BP 113/74 | HR 85 | Ht 62.0 in | Wt 225.0 lb

## 2023-03-03 DIAGNOSIS — Z87442 Personal history of urinary calculi: Secondary | ICD-10-CM

## 2023-03-03 NOTE — Progress Notes (Signed)
  [facility]  Catherine Jenkins, is a 37 y.o. female  [id number]  [id number]  DOB - [birth year]  No chief complaint on file.      Subjective:   Catherine Jenkins is a 37 y.o. female here today for a follow up visit. Patient has No headache, No chest pain, No abdominal pain - No Nausea, No new weakness tingling or numbness, No Cough - shortness of breath  No problems updated.  No Known Allergies  No past medical history on file.  No current outpatient medications on file prior to visit.   No current facility-administered medications on file prior to visit.    Objective:  There were no vitals filed for this visit.  Comprehensive ROS Pertinent positive and negative noted in HPI   Exam General appearance : Awake, alert, not in any distress. Speech Clear. Not toxic looking HEENT: Atraumatic and Normocephalic, pupils equally reactive to light and accomodation Neck: Supple, no JVD. No cervical lymphadenopathy.  Chest: Good air entry bilaterally, no added sounds  CVS: S1 S2 regular, no murmurs.  Abdomen: Bowel sounds present, Non tender and not distended with no gaurding, rigidity or rebound. Extremities: B/L Lower Ext shows no edema, both legs are warm to touch Neurology: Awake alert, and oriented X 3, CN II-XII intact, Non focal Skin: No Rash  Data Review Lab Results  Component Value Date   HGBA1C 5.8 (H) 01/11/2023    Assessment & Plan  Jilliane was seen today for referral and prediabetes.  Diagnoses and all orders for this visit:  Personal history of kidney stones -     Ambulatory referral to Urology    Patient have been counseled extensively about nutrition and exercise. Other issues discussed during this visit include: low cholesterol diet, weight control and daily exercise, foot care, annual eye examinations at Ophthalmology, importance of adherence with medications and regular follow-up. We also discussed long term complications of uncontrolled  diabetes and hypertension.   No follow-ups on file.  The patient was given clear instructions to go to ER or return to medical center if symptoms don't improve, worsen or new problems develop. The patient verbalized understanding. The patient was told to call to get lab results if they haven't heard anything in the next week.   This note has been created with Education officer, environmental. Any transcriptional errors are unintentional.   Grayce Sessions, NP 03/03/2023, 1:32 PM

## 2023-03-14 ENCOUNTER — Encounter (INDEPENDENT_AMBULATORY_CARE_PROVIDER_SITE_OTHER): Payer: Self-pay | Admitting: Primary Care

## 2023-03-16 ENCOUNTER — Ambulatory Visit (INDEPENDENT_AMBULATORY_CARE_PROVIDER_SITE_OTHER): Payer: Medicaid Other | Admitting: Primary Care

## 2023-03-16 ENCOUNTER — Encounter (INDEPENDENT_AMBULATORY_CARE_PROVIDER_SITE_OTHER): Payer: Self-pay

## 2023-09-26 ENCOUNTER — Telehealth (INDEPENDENT_AMBULATORY_CARE_PROVIDER_SITE_OTHER): Payer: Self-pay

## 2023-09-26 NOTE — Telephone Encounter (Signed)
Pt came by the office on 11/7 and dropped off a form for completion. Made Kellen aware that pt will need an appt.

## 2023-10-11 ENCOUNTER — Telehealth (INDEPENDENT_AMBULATORY_CARE_PROVIDER_SITE_OTHER): Payer: Self-pay | Admitting: Primary Care

## 2023-10-11 NOTE — Telephone Encounter (Signed)
Called to remind pt about

## 2023-10-12 ENCOUNTER — Ambulatory Visit (INDEPENDENT_AMBULATORY_CARE_PROVIDER_SITE_OTHER): Payer: Medicaid Other | Admitting: Primary Care

## 2023-10-12 ENCOUNTER — Encounter (INDEPENDENT_AMBULATORY_CARE_PROVIDER_SITE_OTHER): Payer: Self-pay | Admitting: Primary Care

## 2023-10-12 VITALS — BP 102/71 | HR 75 | Resp 16 | Ht 62.0 in | Wt 235.6 lb

## 2023-10-12 DIAGNOSIS — F418 Other specified anxiety disorders: Secondary | ICD-10-CM

## 2023-10-20 NOTE — Progress Notes (Signed)
[facility]  [name], is a 37 y.o. female  [id number]  [id number]  DOB - 27-May-1986  Chief Complaint  Patient presents with   Form Completion       Subjective:   Catherine Jenkins is a 37 y.o. female here today for a paper work. Patient has No headache, No chest pain, No abdominal pain - No Nausea, No new weakness tingling or numbness, No Cough - shortness of breath HPI  No problems updated.  Comprehensive ROS Pertinent positive and negative noted in HPI   No Known Allergies  No past medical history on file.  No current outpatient medications on file prior to visit.   No current facility-administered medications on file prior to visit.   Health Maintenance  Topic Date Due   DTaP/Tdap/Td vaccine (1 - Tdap) Never done   Flu Shot  Never done   COVID-19 Vaccine (1 - 2023-24 season) Never done   Pap with HPV screening  08/29/2023   Hepatitis C Screening  Completed   HIV Screening  Completed   HPV Vaccine  Aged Out    Objective:   Vitals:   10/12/23 1438  BP: 102/71  Pulse: 75  Resp: 16  SpO2: 100%  Weight: 235 lb 9.6 oz (106.9 kg)  Height: 5\' 2"  (1.575 m)   BP Readings from Last 3 Encounters:  10/12/23 102/71  03/03/23 113/74  01/11/23 110/76      Physical Exam Vitals reviewed.  Constitutional:      Appearance: She is obese.  HENT:     Head: Normocephalic.     Right Ear: Tympanic membrane, ear canal and external ear normal.     Left Ear: Tympanic membrane, ear canal and external ear normal.     Nose: Nose normal.     Mouth/Throat:     Mouth: Mucous membranes are moist.  Eyes:     Extraocular Movements: Extraocular movements intact.     Pupils: Pupils are equal, round, and reactive to light.  Cardiovascular:     Rate and Rhythm: Normal rate.  Pulmonary:     Effort: Pulmonary effort is normal.     Breath sounds: Normal breath sounds.  Abdominal:     General: Bowel sounds are normal.     Palpations: Abdomen is soft.   Musculoskeletal:        General: Normal range of motion.     Cervical back: Normal range of motion.  Skin:    General: Skin is warm and dry.  Neurological:     Mental Status: She is alert and oriented to person, place, and time.  Psychiatric:        Mood and Affect: Mood normal.     Assessment & Plan  Barclay was seen today for form completion.  Diagnoses and all orders for this visit:  Depression with anxiety Patient is not harmful to self or others. Appt made for paper work to be completed   Patient have been counseled extensively about nutrition and exercise. Other issues discussed during this visit include: low cholesterol diet, weight control and daily exercise, foot care, annual eye examinations at Ophthalmology, importance of adherence with medications and regular follow-up. We also discussed long term complications of uncontrolled diabetes and hypertension.   6 weeks pap  The patient was given clear instructions to go to ER or return to medical center if symptoms don't improve, worsen or new problems develop. The patient verbalized understanding. The patient was told to call to get lab results if  they haven't heard anything in the next week.   This note has been created with Education officer, environmental. Any transcriptional errors are unintentional.   Catherine Sessions, NP 10/20/2023, 2:22 PM
